# Patient Record
Sex: Female | Born: 2000 | Race: Black or African American | Hispanic: No | Marital: Single | Smoking: Never smoker
Health system: Southern US, Community
[De-identification: ages and names within clinical notes are randomized; demographics above are authoritative.]

## PROBLEM LIST (undated history)

## (undated) DIAGNOSIS — J45909 Unspecified asthma, uncomplicated: Secondary | ICD-10-CM

---
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
## 2018-04-02 ENCOUNTER — Encounter (HOSPITAL_COMMUNITY): Payer: Self-pay | Admitting: Emergency Medicine

## 2018-04-02 ENCOUNTER — Emergency Department (HOSPITAL_COMMUNITY): Payer: No Typology Code available for payment source

## 2018-04-02 ENCOUNTER — Other Ambulatory Visit: Payer: Self-pay

## 2018-04-02 ENCOUNTER — Emergency Department (HOSPITAL_COMMUNITY)
Admission: EM | Admit: 2018-04-02 | Discharge: 2018-04-02 | Disposition: A | Discharge status: Home / Self Care | Payer: No Typology Code available for payment source | Attending: Emergency Medicine | Admitting: Emergency Medicine

## 2018-04-02 DIAGNOSIS — M549 Dorsalgia, unspecified: Secondary | ICD-10-CM | POA: Diagnosis not present

## 2018-04-02 DIAGNOSIS — M791 Myalgia, unspecified site: Secondary | ICD-10-CM | POA: Diagnosis not present

## 2018-04-02 DIAGNOSIS — Y998 Other external cause status: Secondary | ICD-10-CM | POA: Diagnosis not present

## 2018-04-02 DIAGNOSIS — R51 Headache: Secondary | ICD-10-CM | POA: Diagnosis not present

## 2018-04-02 DIAGNOSIS — M542 Cervicalgia: Secondary | ICD-10-CM | POA: Insufficient documentation

## 2018-04-02 DIAGNOSIS — Y9389 Activity, other specified: Secondary | ICD-10-CM | POA: Insufficient documentation

## 2018-04-02 DIAGNOSIS — Y92410 Unspecified street and highway as the place of occurrence of the external cause: Secondary | ICD-10-CM | POA: Insufficient documentation

## 2018-04-02 MED ORDER — ACETAMINOPHEN 500 MG PO TABS: 500.0000 mg | ORAL_TABLET | Freq: Four times a day (QID) | ORAL | 0 refills | Status: AC | PRN

## 2018-04-02 MED ORDER — METHOCARBAMOL 500 MG PO TABS: 500.0000 mg | ORAL_TABLET | Freq: Two times a day (BID) | ORAL | 0 refills | Status: DC

## 2018-04-02 MED ORDER — IBUPROFEN 600 MG PO TABS: 600.0000 mg | ORAL_TABLET | Freq: Four times a day (QID) | ORAL | 0 refills | Status: DC | PRN

## 2018-04-02 MED ORDER — ACETAMINOPHEN 500 MG PO TABS
1000.0000 mg | ORAL_TABLET | Freq: Once | ORAL | Status: AC
  Administered 2018-04-02: 1000 mg via ORAL
  Filled 2018-04-02: qty 2

## 2018-04-02 NOTE — Discharge Instructions (Signed)

## 2018-04-02 NOTE — ED Notes (Signed)
Patient verbalizes understanding of discharge instructions. Opportunity for questioning and answers were provided. Armband removed by staff, pt discharged from ED.  

## 2018-04-02 NOTE — ED Notes (Signed)
Pt in XR. 
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
## 2020-06-19 ENCOUNTER — Other Ambulatory Visit: Payer: Self-pay | Admitting: Internal Medicine

## 2020-06-19 LAB — CBC
HCT: 35.1 % (ref 35.0–45.0)
Hemoglobin: 10.8 g/dL — ABNORMAL LOW (ref 11.7–15.5)
MCH: 26.5 pg — ABNORMAL LOW (ref 27.0–33.0)
MCHC: 30.8 g/dL — ABNORMAL LOW (ref 32.0–36.0)
MCV: 86.2 fL (ref 80.0–100.0)
MPV: 11 fL (ref 7.5–12.5)
Platelets: 264 10*3/uL (ref 140–400)
RBC: 4.07 10*6/uL (ref 3.80–5.10)
RDW: 13.1 % (ref 11.0–15.0)
WBC: 3.2 10*3/uL — ABNORMAL LOW (ref 3.8–10.8)

## 2020-06-19 LAB — EXTRA SPECIMEN
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
## 2020-08-18 ENCOUNTER — Encounter (HOSPITAL_COMMUNITY): Payer: Self-pay | Admitting: Emergency Medicine

## 2020-08-18 ENCOUNTER — Emergency Department (HOSPITAL_COMMUNITY)
Admission: EM | Admit: 2020-08-18 | Discharge: 2020-08-18 | Disposition: A | Discharge status: Home / Self Care | Payer: Medicaid Other | Attending: Emergency Medicine | Admitting: Emergency Medicine

## 2020-08-18 ENCOUNTER — Other Ambulatory Visit: Payer: Self-pay

## 2020-08-18 DIAGNOSIS — J45909 Unspecified asthma, uncomplicated: Secondary | ICD-10-CM | POA: Insufficient documentation

## 2020-08-18 DIAGNOSIS — R1012 Left upper quadrant pain: Secondary | ICD-10-CM | POA: Diagnosis not present

## 2020-08-18 DIAGNOSIS — K5900 Constipation, unspecified: Secondary | ICD-10-CM | POA: Diagnosis not present

## 2020-08-18 HISTORY — DX: Unspecified asthma, uncomplicated: J45.909

## 2020-08-18 LAB — COMPREHENSIVE METABOLIC PANEL
ALT: 12 U/L (ref 0–44)
AST: 14 U/L — ABNORMAL LOW (ref 15–41)
Albumin: 4 g/dL (ref 3.5–5.0)
Alkaline Phosphatase: 39 U/L (ref 38–126)
Anion gap: 7 (ref 5–15)
BUN: 7 mg/dL (ref 6–20)
CO2: 26 mmol/L (ref 22–32)
Calcium: 9.5 mg/dL (ref 8.9–10.3)
Chloride: 104 mmol/L (ref 98–111)
Creatinine, Ser: 0.68 mg/dL (ref 0.44–1.00)
GFR, Estimated: >60 mL/min (ref >60)
Glucose, Bld: 104 mg/dL — ABNORMAL HIGH (ref 70–99)
Potassium: 3.8 mmol/L (ref 3.5–5.1)
Sodium: 137 mmol/L (ref 135–145)
Total Bilirubin: 0.5 mg/dL (ref 0.3–1.2)
Total Protein: 7.3 g/dL (ref 6.5–8.1)

## 2020-08-18 LAB — URINALYSIS, ROUTINE W REFLEX MICROSCOPIC
Bilirubin Urine: NEGATIVE
Glucose, UA: NEGATIVE mg/dL
Hgb urine dipstick: NEGATIVE
Ketones, ur: NEGATIVE mg/dL
Leukocytes,Ua: NEGATIVE
Nitrite: NEGATIVE
Protein, ur: NEGATIVE mg/dL
Specific Gravity, Urine: 1.012 (ref 1.005–1.030)
pH: 6 (ref 5.0–8.0)

## 2020-08-18 LAB — CBC
HCT: 36.6 % (ref 36.0–46.0)
Hemoglobin: 11.5 g/dL — ABNORMAL LOW (ref 12.0–15.0)
MCH: 27.9 pg (ref 26.0–34.0)
MCHC: 31.4 g/dL (ref 30.0–36.0)
MCV: 88.8 fL (ref 80.0–100.0)
Platelets: 246 10*3/uL (ref 150–400)
RBC: 4.12 MIL/uL (ref 3.87–5.11)
RDW: 13.6 % (ref 11.5–15.5)
WBC: 4.4 10*3/uL (ref 4.0–10.5)
nRBC: 0 % (ref 0.0–0.2)

## 2020-08-18 LAB — LIPASE, BLOOD: Lipase: 34 U/L (ref 11–51)

## 2020-08-18 LAB — I-STAT BETA HCG BLOOD, ED (MC, WL, AP ONLY): I-stat hCG, quantitative: <5 m[IU]/mL (ref <5)

## 2020-08-18 MED ORDER — OMEPRAZOLE 20 MG PO CPDR: 20.0000 mg | DELAYED_RELEASE_CAPSULE | Freq: Every day | ORAL | 0 refills | Status: DC

## 2020-08-18 NOTE — ED Triage Notes (Signed)
Patient coming from home, complaint of abdominal pain since yesterday morning. Patient states she took some tums yesterday and felt better, pain back again this morning. VSS. NAD.

## 2020-08-18 NOTE — Discharge Instructions (Addendum)
Take Omeprazole daily as prescribed. Take Miralax and Colace as directed. These are available over the counter.  Follow up with your doctor for recheck. Return to the ER for severe or concerning symptoms.

## 2020-08-18 NOTE — ED Provider Notes (Signed)
[facility] EMERGENCY DEPARTMENT Provider Note   CSN: [id number] Arrival date & time: 08/18/20  9417     History Chief Complaint  Patient presents with   Abdominal Pain    Susan Shaw is a 20 y.o. female.  20 year old female presents with complaint of a dull and stabbing pain on the left upper abdomen onset yesterday, intermittent, improved with Tums.  Reports constipation, denies blood in stools, denies nausea, vomiting, changes in bladder habits, fevers, chills.  No prior abdominal surgeries.  Does not smoke cigarettes or drink alcohol.  No other complaints or concerns.      Past Medical History:  Diagnosis Date   Asthma     There are no problems to display for this patient.   No past surgical history on file.   OB History   No obstetric history on file.     No family history on file.  Social History   Tobacco Use   Smoking status: Never   Smokeless tobacco: Never  Vaping Use   Vaping Use: Never used  Substance Use Topics   Alcohol use: No   Drug use: No    Home Medications Prior to Admission medications   Medication Sig Start Date End Date Taking? Authorizing Provider  omeprazole (PRILOSEC) 20 MG capsule Take 1 capsule (20 mg total) by mouth daily. 08/18/20 09/17/20 Yes Jeannie Fend, PA-C  acetaminophen (TYLENOL) 500 MG tablet Take 1 tablet (500 mg total) by mouth every 6 (six) hours as needed. 04/02/18   Law, Waylan Boga, PA-C  ibuprofen (ADVIL,MOTRIN) 600 MG tablet Take 1 tablet (600 mg total) by mouth every 6 (six) hours as needed. 04/02/18   Law, Waylan Boga, PA-C  methocarbamol (ROBAXIN) 500 MG tablet Take 1 tablet (500 mg total) by mouth 2 (two) times daily. 04/02/18   Emi Holes, PA-C    Allergies    Patient has no known allergies.  Review of Systems   Review of Systems  Constitutional:  Negative for chills and fever.  HENT:  Positive for congestion.   Respiratory:  Negative for shortness of breath.    Cardiovascular:  Negative for chest pain.  Gastrointestinal:  Positive for abdominal pain and constipation. Negative for blood in stool, diarrhea, nausea and vomiting.  Genitourinary:  Negative for dysuria, frequency and vaginal discharge.  Musculoskeletal:  Negative for arthralgias and myalgias.  Skin:  Negative for rash and wound.  Allergic/Immunologic: Negative for immunocompromised state.  Neurological:  Negative for weakness.  Hematological:  Negative for adenopathy.  Psychiatric/Behavioral:  Negative for confusion.   All other systems reviewed and are negative.  Physical Exam Updated Vital Signs BP 111/85   Pulse 66   Temp 98.4 F (36.9 C) (Oral)   Resp 16   Ht 5\' 11"  (1.803 m)   Wt 74.8 kg   LMP 07/19/2020 (Exact Date)   SpO2 100%   BMI 23.01 kg/m   Physical Exam Vitals and nursing note reviewed.  Constitutional:      General: She is not in acute distress.    Appearance: She is well-developed. She is not diaphoretic.  HENT:     Head: Normocephalic and atraumatic.  Cardiovascular:     Rate and Rhythm: Normal rate and regular rhythm.     Heart sounds: Normal heart sounds.  Pulmonary:     Effort: Pulmonary effort is normal.  Abdominal:     General: Bowel sounds are normal.     Palpations: Abdomen is soft.  Tenderness: There is no abdominal tenderness.  Skin:    General: Skin is warm and dry.     Findings: No erythema.  Neurological:     Mental Status: She is alert and oriented to person, place, and time.  Psychiatric:        Behavior: Behavior normal.    ED Results / Procedures / Treatments   Labs (all labs ordered are listed, but only abnormal results are displayed) Labs Reviewed  COMPREHENSIVE METABOLIC PANEL - Abnormal; Notable for the following components:      Result Value   Glucose, Bld 104 (*)    AST 14 (*)    All other components within normal limits  CBC - Abnormal; Notable for the following components:   Hemoglobin 11.5 (*)    All other  components within normal limits  LIPASE, BLOOD  URINALYSIS, ROUTINE W REFLEX MICROSCOPIC  I-STAT BETA HCG BLOOD, ED ([facility], [facility], [facility] ONLY)    EKG None  Radiology No results found.  Procedures Procedures   Medications Ordered in ED Medications - No data to display  ED Course  I have reviewed the triage vital signs and the nursing notes.  Pertinent labs & imaging results that were available during my care of the patient were reviewed by me and considered in my medical decision making (see chart for details).  Clinical Course as of 08/18/20 1019  Sat Aug 18, 2020  3637 20 year old female with left upper abdominal pain as above.  On exam tender.  Labs reassuring bleeding CBC CMP, urinalysis, lipase and negative hCG.  Plan is to treat with omeprazole as she has gotten some relief with Tums.  Patient states that she has been constipated, recommend Colace and MiraLAX.  Advised to recheck with her primary care provider.  Return to ED for severe concerning symptoms. Labs reviewed and reassuring, room air O2 sat 100%. [LM]    Clinical Course User Index [LM] Alden Hipp   MDM Rules/Calculators/A&P                           Final Clinical Impression(s) / ED Diagnoses Final diagnoses:  Left upper quadrant abdominal pain  Constipation, unspecified constipation type    Rx / DC Orders ED Discharge Orders          Ordered    omeprazole (PRILOSEC) 20 MG capsule  Daily        08/18/20 1017             Alden Hipp 08/18/20 1019    Terrilee Files, MD 08/18/20 1754

## 2020-12-20 ENCOUNTER — Other Ambulatory Visit: Payer: Self-pay | Admitting: Internal Medicine

## 2020-12-22 LAB — VITAMIN D 25 HYDROXY (VIT D DEFICIENCY, FRACTURES)

## 2020-12-22 LAB — CBC
HCT: 36.4 % (ref 35.0–45.0)
Hemoglobin: 11.6 g/dL — ABNORMAL LOW (ref 11.7–15.5)
MCH: 28.4 pg (ref 27.0–33.0)
MCHC: 31.9 g/dL — ABNORMAL LOW (ref 32.0–36.0)
MCV: 89 fL (ref 80.0–100.0)
MPV: 11.3 fL (ref 7.5–12.5)
Platelets: 282 10*3/uL (ref 140–400)
RBC: 4.09 10*6/uL (ref 3.80–5.10)
RDW: 12.9 % (ref 11.0–15.0)
WBC: 5.2 10*3/uL (ref 3.8–10.8)

## 2020-12-22 LAB — LIPID PANEL

## 2020-12-22 LAB — COMPLETE METABOLIC PANEL WITH GFR

## 2021-01-02 LAB — LIPID PANEL

## 2021-01-02 LAB — VITAMIN D 25 HYDROXY (VIT D DEFICIENCY, FRACTURES): Vit D, 25-Hydroxy: 19 ng/mL — ABNORMAL LOW (ref 30–100)

## 2021-01-02 LAB — TIQ-MISC

## 2021-01-02 LAB — CBC

## 2021-01-08 ENCOUNTER — Other Ambulatory Visit: Payer: Self-pay | Admitting: Internal Medicine

## 2021-01-08 LAB — COMPLETE METABOLIC PANEL WITH GFR
AG Ratio: 1.8 (calc) (ref 1.0–2.5)
ALT: 9 U/L (ref 6–29)
AST: 13 U/L (ref 10–30)
Albumin: 4.3 g/dL (ref 3.6–5.1)
Alkaline phosphatase (APISO): 39 U/L (ref 31–125)
BUN/Creatinine Ratio: 11 (calc) (ref 6–22)
BUN: 6 mg/dL — ABNORMAL LOW (ref 7–25)
CO2: 23 mmol/L (ref 20–32)
Calcium: 9 mg/dL (ref 8.6–10.2)
Chloride: 105 mmol/L (ref 98–110)
Creat: 0.53 mg/dL (ref 0.50–0.96)
Globulin: 2.4 g/dL (calc) (ref 1.9–3.7)
Glucose, Bld: 65 mg/dL (ref 65–99)
Potassium: 3.7 mmol/L (ref 3.5–5.3)
Sodium: 138 mmol/L (ref 135–146)
Total Bilirubin: 0.6 mg/dL (ref 0.2–1.2)
Total Protein: 6.7 g/dL (ref 6.1–8.1)
eGFR: 134 mL/min/{1.73_m2} (ref >=60)

## 2021-01-08 LAB — CBC
HCT: 35.3 % (ref 35.0–45.0)
Hemoglobin: 11.3 g/dL — ABNORMAL LOW (ref 11.7–15.5)
MCH: 28.9 pg (ref 27.0–33.0)
MCHC: 32 g/dL (ref 32.0–36.0)
MCV: 90.3 fL (ref 80.0–100.0)
MPV: 11.3 fL (ref 7.5–12.5)
Platelets: 235 10*3/uL (ref 140–400)
RBC: 3.91 10*6/uL (ref 3.80–5.10)
RDW: 13.2 % (ref 11.0–15.0)
WBC: 3.6 10*3/uL — ABNORMAL LOW (ref 3.8–10.8)

## 2021-01-08 LAB — LIPID PANEL
Cholesterol: 97 mg/dL (ref <200)
HDL: 61 mg/dL (ref >=50)
LDL Cholesterol (Calc): 23 mg/dL (calc)
Non-HDL Cholesterol (Calc): 36 mg/dL (calc) (ref <130)
Total CHOL/HDL Ratio: 1.6 (calc) (ref <5.0)
Triglycerides: 57 mg/dL (ref <150)

## 2021-01-08 LAB — VITAMIN D 25 HYDROXY (VIT D DEFICIENCY, FRACTURES): Vit D, 25-Hydroxy: 37 ng/mL (ref 30–100)

## 2021-01-17 ENCOUNTER — Ambulatory Visit (HOSPITAL_COMMUNITY)
Admission: EM | Admit: 2021-01-17 | Discharge: 2021-01-17 | Disposition: A | Discharge status: Home / Self Care | Payer: Medicaid Other | Attending: Internal Medicine | Admitting: Internal Medicine

## 2021-01-17 ENCOUNTER — Encounter (HOSPITAL_COMMUNITY): Payer: Self-pay | Admitting: Emergency Medicine

## 2021-01-17 ENCOUNTER — Other Ambulatory Visit: Payer: Self-pay

## 2021-01-17 DIAGNOSIS — R0789 Other chest pain: Secondary | ICD-10-CM | POA: Diagnosis not present

## 2021-01-17 MED ORDER — METHOCARBAMOL 500 MG PO TABS: 500.0000 mg | ORAL_TABLET | Freq: Every evening | ORAL | 0 refills | Status: DC | PRN

## 2021-01-17 MED ORDER — IBUPROFEN 600 MG PO TABS: 600.0000 mg | ORAL_TABLET | Freq: Four times a day (QID) | ORAL | 0 refills | Status: DC | PRN

## 2021-01-17 NOTE — Discharge Instructions (Signed)
Please take medications as prescribed Do not drive or operate heavy machinery after taking muscle relaxants.  Muscle relaxants will make you drowsy. If you experience worsening headaches, nausea, vomiting, blurry vision or confusion please go to the emergency department to be evaluated for concussion.
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
## 2022-06-24 ENCOUNTER — Other Ambulatory Visit: Payer: Self-pay | Admitting: Internal Medicine

## 2022-06-24 LAB — CBC: MCV: 92.7 fL (ref 80.0–100.0)

## 2022-06-25 LAB — BASIC METABOLIC PANEL WITH GFR
BUN: 8 mg/dL (ref 7–25)
CO2: 23 mmol/L (ref 20–32)
Calcium: 9.4 mg/dL (ref 8.6–10.2)
Chloride: 105 mmol/L (ref 98–110)
Creat: 0.54 mg/dL (ref 0.50–0.96)
Glucose, Bld: 81 mg/dL (ref 65–99)
Potassium: 3.9 mmol/L (ref 3.5–5.3)
Sodium: 138 mmol/L (ref 135–146)
eGFR: 133 mL/min/{1.73_m2} (ref >=60)

## 2022-06-25 LAB — IRON, TOTAL/TOTAL IRON BINDING CAP
%SAT: 11 % (calc) — ABNORMAL LOW (ref 16–45)
Iron: 43 ug/dL (ref 40–190)
TIBC: 390 mcg/dL (calc) (ref 250–450)

## 2022-06-25 LAB — CBC
HCT: 35.7 % (ref 35.0–45.0)
Hemoglobin: 11.8 g/dL (ref 11.7–15.5)
MCH: 30.6 pg (ref 27.0–33.0)
MCHC: 33.1 g/dL (ref 32.0–36.0)
MPV: 10.8 fL (ref 7.5–12.5)
Platelets: 253 10*3/uL (ref 140–400)
RBC: 3.85 10*6/uL (ref 3.80–5.10)
RDW: 12 % (ref 11.0–15.0)
WBC: 4.5 10*3/uL (ref 3.8–10.8)

## 2022-06-25 LAB — FOLATE: Folate: 9.5 ng/mL

## 2022-06-25 LAB — VITAMIN B12: Vitamin B-12: 294 pg/mL (ref 200–1100)

## 2022-06-25 LAB — FERRITIN: Ferritin: 8 ng/mL — ABNORMAL LOW (ref 16–154)

## 2022-06-25 LAB — SICKLE CELL SCREEN: Sickle Solubility Test - HGBRFX: NEGATIVE

## 2023-04-07 ENCOUNTER — Ambulatory Visit (HOSPITAL_COMMUNITY)
Admission: EM | Admit: 2023-04-07 | Discharge: 2023-04-07 | Disposition: A | Discharge status: Home / Self Care | Attending: Family Medicine | Admitting: Family Medicine

## 2023-04-07 ENCOUNTER — Encounter (HOSPITAL_COMMUNITY): Payer: Self-pay

## 2023-04-07 DIAGNOSIS — N926 Irregular menstruation, unspecified: Secondary | ICD-10-CM | POA: Diagnosis not present

## 2023-04-07 DIAGNOSIS — R109 Unspecified abdominal pain: Secondary | ICD-10-CM

## 2023-04-07 LAB — POCT URINALYSIS DIP (MANUAL ENTRY)
Bilirubin, UA: NEGATIVE
Glucose, UA: NEGATIVE mg/dL
Ketones, POC UA: NEGATIVE mg/dL
Nitrite, UA: NEGATIVE
Protein Ur, POC: NEGATIVE mg/dL
Spec Grav, UA: 1.015 (ref 1.010–1.025)
Urobilinogen, UA: 0.2 U/dL
pH, UA: 7 (ref 5.0–8.0)

## 2023-04-07 LAB — POCT URINE PREGNANCY: Preg Test, Ur: NEGATIVE

## 2023-04-07 MED ORDER — KETOROLAC TROMETHAMINE 30 MG/ML IJ SOLN
30.0000 mg | Freq: Once | INTRAMUSCULAR | Status: AC
  Administered 2023-04-07: 30 mg via INTRAMUSCULAR

## 2023-04-07 MED ORDER — KETOROLAC TROMETHAMINE 30 MG/ML IJ SOLN
INTRAMUSCULAR | Status: AC
  Filled 2023-04-07: qty 1

## 2023-04-07 MED ORDER — NAPROXEN 375 MG PO TABS: 375.0000 mg | ORAL_TABLET | Freq: Two times a day (BID) | ORAL | 0 refills | Status: DC | PRN

## 2023-04-07 NOTE — ED Provider Notes (Signed)
 [facility]    CSN: [id number] Arrival date & time: 04/07/23  4696      History   Chief Complaint Chief Complaint  Patient presents with   Abdominal Pain    HPI Susan Shaw is a 23 y.o. female.  Patient presents today with lower abdominal cramping and low back pain since last night.  Patient is currently on her menstrual cycle and reports that she attributes pain to the onset of her cycle.  She took Tylenol 1000 mg around 8 AM today but reports symptoms have not improved.  Patient reported menstrual period started last night and the symptoms she is currently experiencing coincided with her menstrual period beginning.  She denies that her period is abnormally heavy.  She denies any dysuria.  Reports Tylenol did take the edge off of her symptoms however the pain did not completely go away.   Past Medical History:  Diagnosis Date   Asthma     There are no active problems to display for this patient.   History reviewed. No pertinent surgical history.  OB History   No obstetric history on file.      Home Medications    Prior to Admission medications   Medication Sig Start Date End Date Taking? Authorizing Provider  naproxen (NAPROSYN) 375 MG tablet Take 1 tablet (375 mg total) by mouth 2 (two) times daily as needed for moderate pain (pain score 4-6). 04/07/23  Yes Bing Neighbors, NP  acetaminophen (TYLENOL) 500 MG tablet Take 1 tablet (500 mg total) by mouth every 6 (six) hours as needed. 04/02/18   Law, Alexandra M, PA-C  FEROSUL 325 (65 Fe) MG tablet Take 325 mg by mouth daily. 12/20/20   [provider]  Vitamin D, Ergocalciferol, (DRISDOL) 1.25 MG (50000 UNIT) CAPS capsule Take 50,000 Units by mouth once a week. 12/20/20   [provider]    Family History History reviewed. No pertinent family history.  Social History Social History   Tobacco Use   Smoking status: Never   Smokeless tobacco: Never  Vaping Use   Vaping status:  Never Used  Substance Use Topics   Alcohol use: No   Drug use: No     Allergies   Patient has no known allergies.   Review of Systems Review of Systems  Gastrointestinal:  Positive for abdominal pain.     Physical Exam Triage Vital Signs ED Triage Vitals [04/07/23 0957]  Encounter Vitals Group     BP 109/73     Systolic BP Percentile      Diastolic BP Percentile      Pulse Rate 75     Resp 16     Temp 98.2 F (36.8 C)     Temp Source Oral     SpO2 98 %     Weight      Height      Head Circumference      Peak Flow      Pain Score 8     Pain Loc      Pain Education      Exclude from Growth Chart    No data found.  Updated Vital Signs BP 109/73 (BP Location: Left Arm)   Pulse 75   Temp 98.2 F (36.8 C) (Oral)   Resp 16   LMP 04/06/2023 (Exact Date)   SpO2 98%   Visual Acuity Right Eye Distance:   Left Eye Distance:   Bilateral Distance:    Right Eye Near:  Left Eye Near:    Bilateral Near:     Physical Exam Constitutional:      Appearance: Normal appearance.  HENT:     Head: Normocephalic and atraumatic.  Eyes:     Extraocular Movements: Extraocular movements intact.     Pupils: Pupils are equal, round, and reactive to light.  Cardiovascular:     Rate and Rhythm: Normal rate and regular rhythm.  Pulmonary:     Effort: Pulmonary effort is normal.     Breath sounds: Normal breath sounds.  Abdominal:     Tenderness: There is abdominal tenderness in the right lower quadrant, suprapubic area and left lower quadrant. There is no right CVA tenderness, left CVA tenderness, guarding or rebound. Negative signs include Murphy's sign.    Musculoskeletal:       Back:  Skin:    General: Skin is warm and dry.  Neurological:     General: No focal deficit present.     Mental Status: She is alert.      UC Treatments / Results  Labs (all labs ordered are listed, but only abnormal results are displayed) Labs Reviewed  POCT URINALYSIS DIP (MANUAL  ENTRY) - Abnormal; Notable for the following components:      Result Value   Clarity, UA cloudy (*)    Blood, UA moderate (*)    Leukocytes, UA Trace (*)    All other components within normal limits  POCT URINE PREGNANCY    EKG   Radiology No results found.  Procedures Procedures (including critical care time)  Medications Ordered in UC Medications  ketorolac (TORADOL) 30 MG/ML injection 30 mg (has no administration in time range)    Initial Impression / Assessment and Plan / UC Course  I have reviewed the triage vital signs and the nursing notes.  Pertinent labs & imaging results that were available during my care of the patient were reviewed by me and considered in my medical decision making (see chart for details).    Treating as suspected menstrual cramping related to menstrual syndrome patient reported improvement with Tylenol although not resolution of symptoms which is reassuring.  Low suspicion for acute abdomen as patient is not exhibiting any pain with palpation of her lower abdomen.  There is a trace of leuks in her urine although patient is asymptomatic of any urinary symptoms and she is not experiencing any flank pain.  Treating patient with a Toradol IM injection here in clinic and advised to resume home management with anti-inflammatories prescribed naproxen 375 twice daily.  Patient advised to wait at least 8 hours before taking first dose of naproxen.  Can continue Tylenol as needed.  Work note provided.  ER precautions given. Final Clinical Impressions(s) / UC Diagnoses   Final diagnoses:  Menstrual syndrome  Abdominal cramping     Discharge Instructions      Your saved a Toradol injection here in clinic.  Do not take any ibuprofen or naproxen or any NSAID related medications for at least 8 hours which would be sometime after 7:30 PM.  You may take Tylenol if needed for pain.  Described to naproxen you may take this medication as needed for any low back pain  or cramping associated with your menstrual period.  If at any point your symptoms worsen or become severe go immediately to the emergency department.     ED Prescriptions     Medication Sig Dispense Auth. Provider   naproxen (NAPROSYN) 375 MG tablet Take 1 tablet (375  mg total) by mouth 2 (two) times daily as needed for moderate pain (pain score 4-6). 20 tablet Bing Neighbors, NP      PDMP not reviewed this encounter.   Bing Neighbors, NP 04/07/23 1126

## 2023-04-07 NOTE — ED Triage Notes (Signed)
 Pt c/o lower abdominal menstrual cramping and lower back pain since last night. Took 1000mg  tylenol at 8am.

## 2023-04-07 NOTE — Discharge Instructions (Signed)
 Your saved a Toradol injection here in clinic.  Do not take any ibuprofen or naproxen or any NSAID related medications for at least 8 hours which would be sometime after 7:30 PM.  You may take Tylenol if needed for pain.  Described to naproxen you may take this medication as needed for any low back pain or cramping associated with your menstrual period.  If at any point your symptoms worsen or become severe go immediately to the emergency department.

## 2023-05-17 ENCOUNTER — Emergency Department (HOSPITAL_COMMUNITY)

## 2023-05-17 ENCOUNTER — Emergency Department (HOSPITAL_COMMUNITY)
Admission: EM | Admit: 2023-05-17 | Discharge: 2023-05-17 | Disposition: A | Discharge status: Home / Self Care | Attending: Emergency Medicine | Admitting: Emergency Medicine

## 2023-05-17 DIAGNOSIS — S0083XA Contusion of other part of head, initial encounter: Secondary | ICD-10-CM | POA: Diagnosis not present

## 2023-05-17 DIAGNOSIS — M25571 Pain in right ankle and joints of right foot: Secondary | ICD-10-CM | POA: Diagnosis not present

## 2023-05-17 DIAGNOSIS — R509 Fever, unspecified: Secondary | ICD-10-CM | POA: Diagnosis not present

## 2023-05-17 DIAGNOSIS — M542 Cervicalgia: Secondary | ICD-10-CM | POA: Insufficient documentation

## 2023-05-17 DIAGNOSIS — R0602 Shortness of breath: Secondary | ICD-10-CM | POA: Diagnosis not present

## 2023-05-17 DIAGNOSIS — Y92410 Unspecified street and highway as the place of occurrence of the external cause: Secondary | ICD-10-CM | POA: Insufficient documentation

## 2023-05-17 DIAGNOSIS — M25511 Pain in right shoulder: Secondary | ICD-10-CM | POA: Insufficient documentation

## 2023-05-17 DIAGNOSIS — S0990XA Unspecified injury of head, initial encounter: Secondary | ICD-10-CM | POA: Diagnosis present

## 2023-05-17 LAB — CBC WITH DIFFERENTIAL/PLATELET
Abs Immature Granulocytes: 0.01 10*3/uL (ref 0.00–0.07)
Basophils Absolute: 0 10*3/uL (ref 0.0–0.1)
Basophils Relative: 1 %
Eosinophils Absolute: 0.1 10*3/uL (ref 0.0–0.5)
Eosinophils Relative: 2 %
HCT: 36.6 % (ref 36.0–46.0)
Hemoglobin: 12.2 g/dL (ref 12.0–15.0)
Immature Granulocytes: 0 %
Lymphocytes Relative: 34 %
Lymphs Abs: 1.8 10*3/uL (ref 0.7–4.0)
MCH: 31.9 pg (ref 26.0–34.0)
MCHC: 33.3 g/dL (ref 30.0–36.0)
MCV: 95.6 fL (ref 80.0–100.0)
Monocytes Absolute: 0.5 10*3/uL (ref 0.1–1.0)
Monocytes Relative: 9 %
Neutro Abs: 2.9 10*3/uL (ref 1.7–7.7)
Neutrophils Relative %: 54 %
Platelets: 251 10*3/uL (ref 150–400)
RBC: 3.83 MIL/uL — ABNORMAL LOW (ref 3.87–5.11)
RDW: 11.8 % (ref 11.5–15.5)
WBC: 5.3 10*3/uL (ref 4.0–10.5)
nRBC: 0 % (ref 0.0–0.2)

## 2023-05-17 LAB — BASIC METABOLIC PANEL WITH GFR
Anion gap: 8 (ref 5–15)
BUN: 10 mg/dL (ref 6–20)
CO2: 23 mmol/L (ref 22–32)
Calcium: 9.1 mg/dL (ref 8.9–10.3)
Chloride: 106 mmol/L (ref 98–111)
Creatinine, Ser: 0.65 mg/dL (ref 0.44–1.00)
GFR, Estimated: >60 mL/min (ref >60)
Glucose, Bld: 117 mg/dL — ABNORMAL HIGH (ref 70–99)
Potassium: 3.9 mmol/L (ref 3.5–5.1)
Sodium: 137 mmol/L (ref 135–145)

## 2023-05-17 LAB — PREGNANCY, URINE: Preg Test, Ur: NEGATIVE

## 2023-05-17 MED ORDER — OXYCODONE HCL 5 MG PO TABS
5.0000 mg | ORAL_TABLET | Freq: Once | ORAL | Status: AC
  Administered 2023-05-17: 5 mg via ORAL
  Filled 2023-05-17: qty 1

## 2023-05-17 MED ORDER — FENTANYL CITRATE PF 50 MCG/ML IJ SOSY: 50.0000 ug | PREFILLED_SYRINGE | INTRAMUSCULAR | Status: DC | PRN

## 2023-05-17 NOTE — Progress Notes (Signed)
 Orthopedic Tech Progress Note Patient Details:  Susan Shaw 03/20/2000 [id number]  Ortho Devices Type of Ortho Device: Arm sling Ortho Device/Splint Location: rue Ortho Device/Splint Interventions: Ordered, Adjustment, Application   Post Interventions Patient Tolerated: Well Instructions Provided: Care of device, Adjustment of device  [name] 05/17/2023, 11:29 PM
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
## 2023-05-23 NOTE — Progress Notes (Deleted)
 ANNUAL EXAM Patient name: Susan Shaw MRN [id number]  Date of birth: 2000/09/07 Chief Complaint:   No chief complaint on file.  History of Present Illness:   Susan Shaw is a 23 y.o. No obstetric history on file. {race:25618} female being seen today for a routine annual exam.  Current complaints: ***  No LMP recorded.   The pregnancy intention screening data noted above was reviewed. Potential methods of contraception were discussed. The patient elected to proceed with No data recorded.   Last pap ***. Results were: {Pap findings:25134}. H/O abnormal pap: {yes/yes***/no:23866} Last mammogram: ***. Results were: {normal, abnormal, n/a:23837}. Family h/o breast cancer: {yes***/no:23838} Last colonoscopy: ***. Results were: {normal, abnormal, n/a:23837}. Family h/o colorectal cancer: {yes***/no:23838} STI screening:  Contraception:      11/01/2015    5:52 PM  Depression screen PHQ 2/9  Decreased Interest 0  Down, Depressed, Hopeless 0  PHQ - 2 Score 0  Altered sleeping 0  Tired, decreased energy 0  Change in appetite 0  Feeling bad or failure about yourself  0  Trouble concentrating 0  Moving slowly or fidgety/restless 0  Suicidal thoughts 0  PHQ-9 Score 0         No data to display           Review of Systems:   Pertinent items are noted in HPI Denies any headaches, blurred vision, fatigue, shortness of breath, chest pain, abdominal pain, abnormal vaginal discharge/itching/odor/irritation, problems with periods, bowel movements, urination, or intercourse unless otherwise stated above. Pertinent History Reviewed:  Reviewed past medical,surgical, social and family history.  Reviewed problem list, medications and allergies. Physical Assessment:  There were no vitals filed for this visit.There is no height or weight on file to calculate BMI.        Physical Examination:   General appearance - well appearing, and in no distress  Mental status - alert,  oriented to person, place, and time  Psych:  She has a normal mood and affect  Skin - warm and dry, normal color, no suspicious lesions noted  Chest - effort normal, all lung fields clear to auscultation bilaterally  Heart - normal rate and regular rhythm  Neck:  midline trachea, no thyromegaly or nodules  Breasts - breasts appear normal, no suspicious masses, no skin or nipple changes or  axillary nodes  Abdomen - soft, nontender, nondistended, no masses or organomegaly  Pelvic - VULVA: normal appearing vulva with no masses, tenderness or lesions  VAGINA: normal appearing vagina with normal color and discharge, no lesions  CERVIX: normal appearing cervix without discharge or lesions, no CMT  Thin prep pap is {Desc; done/not:10129} *** HR HPV cotesting  UTERUS: uterus is felt to be normal size, shape, consistency and nontender   ADNEXA: No adnexal masses or tenderness noted.  Rectal - normal rectal, good sphincter tone, no masses felt. Hemoccult: ***  Extremities:  No swelling or varicosities noted  Chaperone present for exam  No results found for this or any previous visit (from the past 24 hours).  Assessment & Plan:  - Cervical cancer screening: Discussed screening Q3 years. Reviewed importance of annual exams and limits of pap smear. Pap with reflex HPV *** - GC/CT: Discussed and recommended. Pt  {Blank single:19197::"accepts","declines"} - Gardasil: {Blank single:19197::"***","has not yet had. Will provide information","completed","has not yet had. Counseling provided and she declines","Has not yet had. Counseling provided and pt accepts"} - Birth Control: {Birth control type:23956} - Breast Health: Encouraged self breast awareness/exams. Teaching provided. -  Mammogram: {Mammo f/u:25212::"@ 25yo"}, or sooner if problems - Colonoscopy: {TCS f/u:25213::"@ 25yo"}, or sooner if problems - Follow-up: 12 months and prn     No orders of the defined types were placed in this  encounter.   Meds: No orders of the defined types were placed in this encounter.   Follow-up: No follow-ups on file.  Maevyn Riordan E Adrieanna Boteler, [place] 05/23/2023 12:23 PM

## 2023-05-27 ENCOUNTER — Ambulatory Visit: Admitting: Physician Assistant

## 2023-05-27 DIAGNOSIS — Z01419 Encounter for gynecological examination (general) (routine) without abnormal findings: Secondary | ICD-10-CM

## 2023-05-27 DIAGNOSIS — Z124 Encounter for screening for malignant neoplasm of cervix: Secondary | ICD-10-CM

## 2023-06-17 ENCOUNTER — Encounter: Payer: Self-pay | Admitting: Physician Assistant

## 2023-06-17 ENCOUNTER — Ambulatory Visit: Admitting: Physician Assistant

## 2023-06-17 VITALS — BP 105/66 | HR 69 | Ht 71.0 in | Wt 169.5 lb

## 2023-06-17 DIAGNOSIS — N946 Dysmenorrhea, unspecified: Secondary | ICD-10-CM | POA: Diagnosis not present

## 2023-06-17 DIAGNOSIS — Z124 Encounter for screening for malignant neoplasm of cervix: Secondary | ICD-10-CM

## 2023-06-17 MED ORDER — NAPROXEN 500 MG PO TABS: 500.0000 mg | ORAL_TABLET | Freq: Two times a day (BID) | ORAL | 0 refills | Status: DC

## 2023-06-17 NOTE — Progress Notes (Signed)
 Pt presents for annual visit/ establish care. Reports having intense menstrual cramps, worsening past 5 months. Had to go to ER due to pain. Refuses all STD testing.

## 2023-06-17 NOTE — Progress Notes (Signed)
 GYNECOLOGY  VISIT   HPI: Susan Shaw is a 23 y.o.   Single female here for painful periods.     Patient reports 8/10 painful periods for the past 5 to 6 months, especially first two days of menses.  This is distinct from her usual cramping during menses at 4/10 pain. Midline pain occurs within first couple hours of bleeding and is accompanied by lower abdominal, lower back, leg pain.  She has been seen at the ED previously for this issue.  Has used ibuprofen , Tylenol , naproxen ; naproxen  has provided her greatest relief.  Cycles occur every 28 days with bleeding duration of 6 days with moderate flow. She endorses associated headache, pelvic pressure. She has never engaged in oral, anal, vaginal sex.   Patient denies fever, n/v,  mood changes, dysuria, urinary frequency/urgency, urinary incontinence, constipation.   GYN hx LMP: 05/28/23 Contraception: None, never sexually active Last mammogram: Never done due to age Last pap smear:  Never done   OB History   No obstetric history on file.        There are no active problems to display for this patient.   Past Medical History:  Diagnosis Date   Asthma     No past surgical history on file.  Current Outpatient Medications  Medication Sig Dispense Refill   acetaminophen  (TYLENOL ) 500 MG tablet Take 1 tablet (500 mg total) by mouth every 6 (six) hours as needed. 30 tablet 0   naproxen  (NAPROSYN ) 500 MG tablet Take 1 tablet (500 mg total) by mouth 2 (two) times daily with a meal. As need for pain during periods only. 60 tablet 0   Vitamin D , Ergocalciferol , (DRISDOL) 1.25 MG (50000 UNIT) CAPS capsule Take 50,000 Units by mouth once a week.     FEROSUL 325 (65 Fe) MG tablet Take 325 mg by mouth daily.     No current facility-administered medications for this visit.     ALLERGIES: Patient has no known allergies.  No family history on file.  Social History   Socioeconomic History   Marital status: Single    Spouse name: Not  on file   Number of children: Not on file   Years of education: Not on file   Highest education level: Not on file  Occupational History   Not on file  Tobacco Use   Smoking status: Never   Smokeless tobacco: Never  Vaping Use   Vaping status: Never Used  Substance and Sexual Activity   Alcohol use: No   Drug use: No   Sexual activity: Not Currently    Birth control/protection: None  Other Topics Concern   Not on file  Social History Narrative   Not on file   Social Drivers of Health   Financial Resource Strain: Not on file  Food Insecurity: Not on file  Transportation Needs: Not on file  Physical Activity: Not on file  Stress: Not on file  Social Connections: Not on file  Intimate Partner Violence: Not on file    Review of Systems  PHYSICAL EXAMINATION:    BP 105/66   Pulse 69   Ht 5\' 11"  (1.803 m)   Wt 169 lb 8 oz (76.9 kg)   LMP 05/28/2023 (Exact Date)   BMI 23.64 kg/m     General appearance: alert, cooperative and appears stated age Head: Normocephalic, without obvious abnormality, atraumatic Neck: no adenopathy, supple, symmetrical, trachea midline and thyroid normal to inspection and palpation Lungs: clear to auscultation bilaterally Breasts: normal appearance, no  masses or tenderness, No nipple retraction or dimpling, No nipple discharge or bleeding, No axillary or supraclavicular adenopathy Heart: regular rate and rhythm Abdomen: soft, non-tender, no masses,  no organomegaly Extremities: extremities normal, atraumatic, no cyanosis or edema Skin: +moderate-severe papulopustular acne. Skin color, texture, turgor normal. No rashes or lesions Lymph nodes: Cervical, supraclavicular, and axillary nodes normal. Neurologic: Grossly normal Pelvic: Negative suprapubic pain Bimanual Exam: Deferred  Chaperone was present for exam  ASSESSMENT & PLAN  There are no diagnoses linked to this encounter.   1. Dysmenorrhea (Primary) Pleasant 23 yo F without  pertinent PMH presents with regular menses with 8/10 pain onset 5-6 months ago with accompanying lower back and lower abdominal pain. Distinct from previous menstrual cramping with 4/10 pain. Naproxen  previously relieved symptoms. No pelvic exam today due to never sexually active. Considered endometriosis, fibroids, structural abnormalities. No abnormal bleeding to suggest adenomyosis. Low concern for IBS/IBD given no change in bowel/bladder habits. No urinary sxs to suggest IC. Patient agrees to pelvic ultrasound; we discussed less detail with transabdominal US  and she states understanding.  - naproxen  (NAPROSYN ) 500 MG tablet; Take 1 tablet (500 mg total) by mouth 2 (two) times daily with a meal. As need for pain during periods only.  Dispense: 60 tablet; Refill: 0 - US  PELVIC COMPLETE WITH TRANSVAGINAL; Future    2. Cervical cancer screening Discussed cervical cancer screening guidelines with patient; we discussed that though she has never been sexually active, cervical cancer can develop without presence of HPV. She stated understanding and would like to defer testing today. I encouraged that she continue to come for regular annuals to revisit this matter.    An After Visit Summary was printed and given to the patient.  Alyssia Heese E Jamileth Putzier, PA-C 5/14/20259:02 PM

## 2023-06-25 ENCOUNTER — Other Ambulatory Visit: Payer: Self-pay | Admitting: Physician Assistant

## 2023-06-25 ENCOUNTER — Ambulatory Visit (HOSPITAL_COMMUNITY)
Admission: RE | Admit: 2023-06-25 | Discharge: 2023-06-25 | Disposition: A | Discharge status: Home / Self Care | Source: Ambulatory Visit | Attending: Physician Assistant | Admitting: Physician Assistant

## 2023-06-25 DIAGNOSIS — Z124 Encounter for screening for malignant neoplasm of cervix: Secondary | ICD-10-CM

## 2023-06-25 DIAGNOSIS — N946 Dysmenorrhea, unspecified: Secondary | ICD-10-CM | POA: Insufficient documentation

## 2023-07-08 ENCOUNTER — Ambulatory Visit: Payer: Self-pay | Admitting: Physician Assistant

## 2023-07-08 DIAGNOSIS — R1900 Intra-abdominal and pelvic swelling, mass and lump, unspecified site: Secondary | ICD-10-CM

## 2023-07-29 ENCOUNTER — Ambulatory Visit: Admitting: Physician Assistant

## 2023-07-29 VITALS — BP 105/72 | HR 67 | Wt 168.6 lb

## 2023-07-29 DIAGNOSIS — N9489 Other specified conditions associated with female genital organs and menstrual cycle: Secondary | ICD-10-CM | POA: Diagnosis not present

## 2023-07-29 DIAGNOSIS — N946 Dysmenorrhea, unspecified: Secondary | ICD-10-CM

## 2023-07-29 MED ORDER — LORAZEPAM 0.5 MG PO TABS: 0.5000 mg | ORAL_TABLET | ORAL | 0 refills | Status: AC | PRN

## 2023-07-29 NOTE — Progress Notes (Signed)
 Pt is in the office to discuss U/S results from 06/25/23. Pt also states that she wants to discuss getting her hormone levels checked. LMP 07/22/23

## 2023-07-29 NOTE — Progress Notes (Signed)
 GYNECOLOGY  VISIT   HPI: Susan Shaw is a 23 y.o.   Single female here for review of ultrasound results. Patient notices that cyclic pain comes on just before menstrual bleeding. Pain has significantly improved using naproxen .   GYNECOLOGIC HISTORY: No LMP recorded. Contraception: None, not sexually active  Menopausal hormone therapy: premenopausal Last mammogram:  Not yet indicated due to age Last pap smear: No results found for: DIAGPAP         OB History   No obstetric history on file.        There are no active problems to display for this patient.   Past Medical History:  Diagnosis Date   Asthma     No past surgical history on file.  Current Outpatient Medications  Medication Sig Dispense Refill   acetaminophen  (TYLENOL ) 500 MG tablet Take 1 tablet (500 mg total) by mouth every 6 (six) hours as needed. 30 tablet 0   FEROSUL 325 (65 Fe) MG tablet Take 325 mg by mouth daily.     naproxen  (NAPROSYN ) 500 MG tablet Take 1 tablet (500 mg total) by mouth 2 (two) times daily with a meal. As need for pain during periods only. 60 tablet 0   Vitamin D , Ergocalciferol , (DRISDOL) 1.25 MG (50000 UNIT) CAPS capsule Take 50,000 Units by mouth once a week.     No current facility-administered medications for this visit.     ALLERGIES: Patient has no known allergies.  No family history on file.  Social History   Socioeconomic History   Marital status: Single    Spouse name: Not on file   Number of children: Not on file   Years of education: Not on file   Highest education level: Not on file  Occupational History   Not on file  Tobacco Use   Smoking status: Never   Smokeless tobacco: Never  Vaping Use   Vaping status: Never Used  Substance and Sexual Activity   Alcohol use: No   Drug use: No   Sexual activity: Not Currently    Birth control/protection: None  Other Topics Concern   Not on file  Social History Narrative   Not on file   Social Drivers of  Health   Financial Resource Strain: Not on file  Food Insecurity: Not on file  Transportation Needs: Not on file  Physical Activity: Not on file  Stress: Not on file  Social Connections: Not on file  Intimate Partner Violence: Not on file    Review of Systems  IMAGING:  Transabdominal ultrasound 06/25/23  Focal hypoechoic complex mass in the fundal region of the uterus measuring 4.3 x 3.5 x 4.2 cm predominantly cystic with some solid internal dependent echoes. Findings could probably be related to a left adnexal cyst that is located anterior fundal region of the uterus rather than a uterine mass. Correlation with MRI recommended for better characterization of the lesion.  PHYSICAL EXAMINATION:    There were no vitals taken for this visit.    General appearance: alert, cooperative and appears stated age Head: Normocephalic, without obvious abnormality, atraumatic Heart: regular rate and rhythm Extremities: extremities normal, atraumatic, no cyanosis or edema Neurologic: Grossly normal  Chaperone was present for exam  ASSESSMENT & PLAN   1. Adnexal mass (Primary) 2. Dysmenorrhea  Adnexal vs uterine mass found on transabdominal US  06/25/23, part of ongoing work-up for dysmenorrhea x 3-4 months. We discussed findings of current imaging and that MRI is needed for further characterization of the mass.  Patient was offered and accepted anxiolytic. We reviewed that she must have a separate party transport her to and from the MRI with use of this medication, to which she stated understanding. Unclear if this mass is definitive source of patient's episodes of cyclic pain, but pain has been well-controlled in visit interim on Naproxen  500 mg. Will continue to follow with her after MRI results return.  - LORazepam (ATIVAN) 0.5 MG tablet; Take 1 tablet (0.5 mg total) by mouth as needed for up to 2 doses. Take 1 tablet 60 minutes before MRI. May add 1 additional tablet if feeling no effect 30  minutes after first dose.  Dispense: 2 tablet; Refill: 0   An After Visit Summary was printed and given to the patient.  Jocelyn Lowery E Beckett Maden, PA-C 6/25/20257:54 AM

## 2023-08-05 ENCOUNTER — Encounter: Payer: Self-pay | Admitting: Physician Assistant

## 2023-08-08 ENCOUNTER — Ambulatory Visit
Admission: RE | Admit: 2023-08-08 | Discharge: 2023-08-08 | Disposition: A | Discharge status: Home / Self Care | Source: Ambulatory Visit | Attending: Physician Assistant | Admitting: Physician Assistant

## 2023-08-08 DIAGNOSIS — R1900 Intra-abdominal and pelvic swelling, mass and lump, unspecified site: Secondary | ICD-10-CM

## 2023-08-08 MED ORDER — GADOPICLENOL 0.5 MMOL/ML IV SOLN
8.0000 mL | Freq: Once | INTRAVENOUS | Status: AC | PRN
  Administered 2023-08-08: 8 mL via INTRAVENOUS

## 2023-09-02 ENCOUNTER — Ambulatory Visit: Admitting: Physician Assistant

## 2023-09-02 DIAGNOSIS — Z91199 Patient's noncompliance with other medical treatment and regimen due to unspecified reason: Secondary | ICD-10-CM

## 2023-09-02 NOTE — Progress Notes (Signed)
 Patient no-showed today's appointment.

## 2023-09-17 ENCOUNTER — Ambulatory Visit (INDEPENDENT_AMBULATORY_CARE_PROVIDER_SITE_OTHER): Admitting: Physician Assistant

## 2023-09-17 VITALS — BP 115/69 | HR 76 | Ht 71.0 in | Wt 174.1 lb

## 2023-09-17 DIAGNOSIS — N946 Dysmenorrhea, unspecified: Secondary | ICD-10-CM | POA: Diagnosis not present

## 2023-09-17 NOTE — Progress Notes (Signed)
 Pt presents for f/p on us . Pt has no questions or concerns at this time.

## 2023-09-20 MED ORDER — NAPROXEN 500 MG PO TABS
ORAL_TABLET | ORAL | 2 refills | Status: DC
Start: 2023-09-20 — End: 2023-12-07

## 2023-09-20 NOTE — Progress Notes (Signed)
 GYNECOLOGY  VISIT   HPI: Susan Shaw is a 23 y.o.  single female No obstetric history on file. here for review of MRI pelvis results during work-up for dysmenorrhea.   GYNECOLOGIC HISTORY: Patient's last menstrual period was 09/14/2023 (exact date). Contraception: None Menopausal hormone therapy: Premenopausal  Last mammogram:  Never previously done due to age Last pap smear: Never previously done due to age        OB History   No obstetric history on file.        There are no active problems to display for this patient.   Past Medical History:  Diagnosis Date   Asthma     No past surgical history on file.  Current Outpatient Medications  Medication Sig Dispense Refill   acetaminophen  (TYLENOL ) 500 MG tablet Take 1 tablet (500 mg total) by mouth every 6 (six) hours as needed. 30 tablet 0   FEROSUL 325 (65 Fe) MG tablet Take 325 mg by mouth daily.     LORazepam  (ATIVAN ) 0.5 MG tablet Take 1 tablet (0.5 mg total) by mouth as needed for up to 2 doses. Take 1 tablet 60 minutes before MRI. May add 1 additional tablet if feeling no effect 30 minutes after first dose. 2 tablet 0   naproxen  (NAPROSYN ) 500 MG tablet Take 1 tablet (500 mg total) by mouth 2 (two) times daily with a meal. As need for pain during periods only. 60 tablet 0   Vitamin D , Ergocalciferol , (DRISDOL) 1.25 MG (50000 UNIT) CAPS capsule Take 50,000 Units by mouth once a week.     No current facility-administered medications for this visit.     ALLERGIES: Pork-derived products  No family history on file.  Social History   Socioeconomic History   Marital status: Single    Spouse name: Not on file   Number of children: Not on file   Years of education: Not on file   Highest education level: Not on file  Occupational History   Not on file  Tobacco Use   Smoking status: Never   Smokeless tobacco: Never  Vaping Use   Vaping status: Never Used  Substance and Sexual Activity   Alcohol use: No    Drug use: No   Sexual activity: Not Currently    Birth control/protection: None  Other Topics Concern   Not on file  Social History Narrative   Not on file   Social Drivers of Health   Financial Resource Strain: Not on file  Food Insecurity: Not on file  Transportation Needs: Not on file  Physical Activity: Not on file  Stress: Not on file  Social Connections: Not on file  Intimate Partner Violence: Not on file    Review of Systems  PHYSICAL EXAMINATION:    BP 115/69   Pulse 76   Ht 5' 11 (1.803 m)   Wt 174 lb 1.6 oz (79 kg)   LMP 09/14/2023 (Exact Date)   BMI 24.28 kg/m     General appearance: alert, cooperative and appears stated age Head: Normocephalic, without obvious abnormality, atraumatic Neck: no adenopathy, supple, symmetrical, trachea midline and thyroid normal to inspection and palpation Lungs: Normal respiratory effort Heart: regular rate  Neurologic: Grossly normal  ASSESSMENT & PLAN  1. Dysmenorrhea (Primary)  Pleasant 23 yo female patient presenting for review of MRI pelvis 08/08/23. The study was recommended for better characterization after TVUS showed 4.3 cm uterine vs adnexal mass in the context of dysmenorrhea. MRI showed no masses, though varices of  uterus and adnexa were apparent. Given all components of patient's work-up so far, considered pelvic congestion disorder, and cannot rule-out endometriosis or ovarian cysts. Current pain regimen, naproxen  500 mg during menses, working well at this time. Patient given UTD patient information about primary vs. secondary dysmenorrhea, potential causes, and treatments. Advised continue regimen for now, and return for evaluation if symptoms worsen, NSAIDs become ineffective, or she desires additional intervention to determine underlying cause of dysmenorrhea. Patient voiced understanding and agreement with plan.  - naproxen  (NAPROSYN ) 500 MG tablet; Take twice daily with meals as needed for pain.  Dispense: 60  tablet; Refill: 2   An After Visit Summary was printed and given to the patient.  Madinah Quarry E Satin Boal, PA-C 8/17/202512:00 PM

## 2023-10-26 ENCOUNTER — Ambulatory Visit (INDEPENDENT_AMBULATORY_CARE_PROVIDER_SITE_OTHER): Admitting: Physician Assistant

## 2023-10-26 ENCOUNTER — Encounter: Payer: Self-pay | Admitting: Physician Assistant

## 2023-10-26 VITALS — BP 99/67 | HR 61

## 2023-10-26 DIAGNOSIS — L700 Acne vulgaris: Secondary | ICD-10-CM

## 2023-10-26 DIAGNOSIS — L709 Acne, unspecified: Secondary | ICD-10-CM

## 2023-10-26 MED ORDER — TRETINOIN 0.025 % EX CREA: TOPICAL_CREAM | CUTANEOUS | 2 refills | Status: AC

## 2023-10-26 MED ORDER — DOXYCYCLINE HYCLATE 100 MG PO CAPS: 100.0000 mg | ORAL_CAPSULE | Freq: Every day | ORAL | 0 refills | Status: AC

## 2023-10-26 MED ORDER — DOXYCYCLINE HYCLATE 100 MG PO CAPS: 100.0000 mg | ORAL_CAPSULE | Freq: Every day | ORAL | 0 refills | Status: DC

## 2023-10-26 MED ORDER — TRETINOIN 0.025 % EX CREA: TOPICAL_CREAM | Freq: Every day | CUTANEOUS | 2 refills | Status: DC

## 2023-10-26 NOTE — Patient Instructions (Signed)

## 2023-10-26 NOTE — Progress Notes (Signed)
   New Patient Visit   Subjective  Susan Shaw is a 23 y.o. female NEW PATIENT who presents for the following: Acne/hyperpigmentation  Patient states she has acne and hyperpigmentation located at the face that she would like to have examined. Patient reports the areas have been there for 9 months. She reports the areas are not bothersome.She states that the areas have not spread. Patient reports she has not previously been treated for these areas. Patient denies Hx of bx. Patient denies family history of skin cancer(s).  The patient has spots, moles and lesions to be evaluated, some may be new or changing and the patient may have concern these could be cancer.   The following portions of the chart were reviewed this encounter and updated as appropriate: medications, allergies, medical history  Review of Systems:  No other skin or systemic complaints except as noted in HPI or Assessment and Plan.  Objective  Well appearing patient in no apparent distress; mood and affect are within normal limits.  A focused examination was performed of the following areas: face  Relevant exam findings are noted in the Assessment and Plan.           Assessment & Plan   ACNE VULGARIS Exam: Inflammatory papules, pustules   Flared  Treatment Plan: -  wash with BP wash in am / Vanicream gentle facial cleanser in pm  - oral doxy x 1 month  - tretinoin  nightly as can tolerate  - f/u in 3 mo       ACNE, UNSPECIFIED ACNE TYPE   Related Medications doxycycline  (VIBRAMYCIN ) 100 MG capsule Take 1 capsule (100 mg total) by mouth daily. tretinoin  (RETIN-A ) 0.025 % cream Apply topically at bedtime.  Return in about 3 months (around 01/25/2024) for Acne follow up.  I, Doyce Pan, CMA, am acting as scribe for Akirra Lacerda K, PA-C.   Documentation: I have reviewed the above documentation for accuracy and completeness, and I agree with the above.  Jaxen Samples K, PA-C

## 2023-12-07 ENCOUNTER — Other Ambulatory Visit: Payer: Self-pay

## 2023-12-07 DIAGNOSIS — N946 Dysmenorrhea, unspecified: Secondary | ICD-10-CM

## 2023-12-07 MED ORDER — NAPROXEN 500 MG PO TABS
ORAL_TABLET | ORAL | 0 refills | Status: AC
Start: 2023-12-07 — End: ?

## 2024-02-08 ENCOUNTER — Ambulatory Visit: Admitting: Physician Assistant

## 2024-02-08 ENCOUNTER — Encounter: Payer: Self-pay | Admitting: Physician Assistant

## 2024-02-08 VITALS — BP 86/64

## 2024-02-08 DIAGNOSIS — L709 Acne, unspecified: Secondary | ICD-10-CM

## 2024-02-08 DIAGNOSIS — L700 Acne vulgaris: Secondary | ICD-10-CM

## 2024-02-08 NOTE — Patient Instructions (Signed)

## 2024-02-08 NOTE — Progress Notes (Signed)
" ° °  Follow-Up Visit   Subjective  Susan Shaw is a 24 y.o. female ESTABLISHED PATIENT who presents for the following: Acne Vulgaris follow up - she states she is doing much better. She washes with BPO in the morning and Vanicream at night. She is using tretinoin  0.025% nightly. She took doxycycline  for 1 month.    The following portions of the chart were reviewed this encounter and updated as appropriate: medications, allergies, medical history  Review of Systems:  No other skin or systemic complaints except as noted in HPI or Assessment and Plan.  Objective  Well appearing patient in no apparent distress; mood and affect are within normal limits.  Areas Examined: Face, chest and back  Relevant exam findings are noted in the Assessment and Plan.           Assessment & Plan   ACNE, UNSPECIFIED ACNE TYPE   Existing Treatments - tretinoin  (RETIN-A ) 0.025 % cream - Apply every other night building up to nightly to face as directed ACNE VULGARIS Exam: resolving papules and post inflammatory hyperpigmentation    Improved   Treatment Plan: Continue tretinoin  nightly.  F/U in 6 months.      Return in about 6 months (around 08/07/2024) for Acne.  I, Roseline Hutchinson, CMA, am acting as scribe for Caroleen Stoermer K, PA-C .   Documentation: I have reviewed the above documentation for accuracy and completeness, and I agree with the above.  Nikesha Kwasny K, PA-C  "

## 2024-08-08 ENCOUNTER — Ambulatory Visit: Admitting: Physician Assistant
